# Patient Record
Sex: Male | Born: 1975 | Race: White | Hispanic: No | Marital: Single
Health system: Southern US, Community
[De-identification: ages and names within clinical notes are randomized; demographics above are authoritative.]

---

## 1999-06-15 ENCOUNTER — Encounter: Payer: Self-pay | Admitting: Surgery

## 1999-06-15 ENCOUNTER — Ambulatory Visit (HOSPITAL_COMMUNITY): Admission: RE | Admit: 1999-06-15 | Discharge: 1999-06-15 | Payer: Self-pay | Admitting: Surgery

## 2005-10-11 ENCOUNTER — Inpatient Hospital Stay (HOSPITAL_COMMUNITY): Admission: AC | Admit: 2005-10-11 | Discharge: 2005-10-18 | Payer: Self-pay

## 2005-10-14 ENCOUNTER — Ambulatory Visit: Payer: Self-pay | Admitting: Physical Medicine & Rehabilitation

## 2008-02-13 IMAGING — CR DG ELBOW 2V*L*
2 series · 2 of 2 positions shown · non-contrast
Comparison: none

CLINICAL DATA: 29-year-old with left elbow pain.
 LEFT ELBOW ? 2 VIEW:

[view not recorded (1 of 2)]
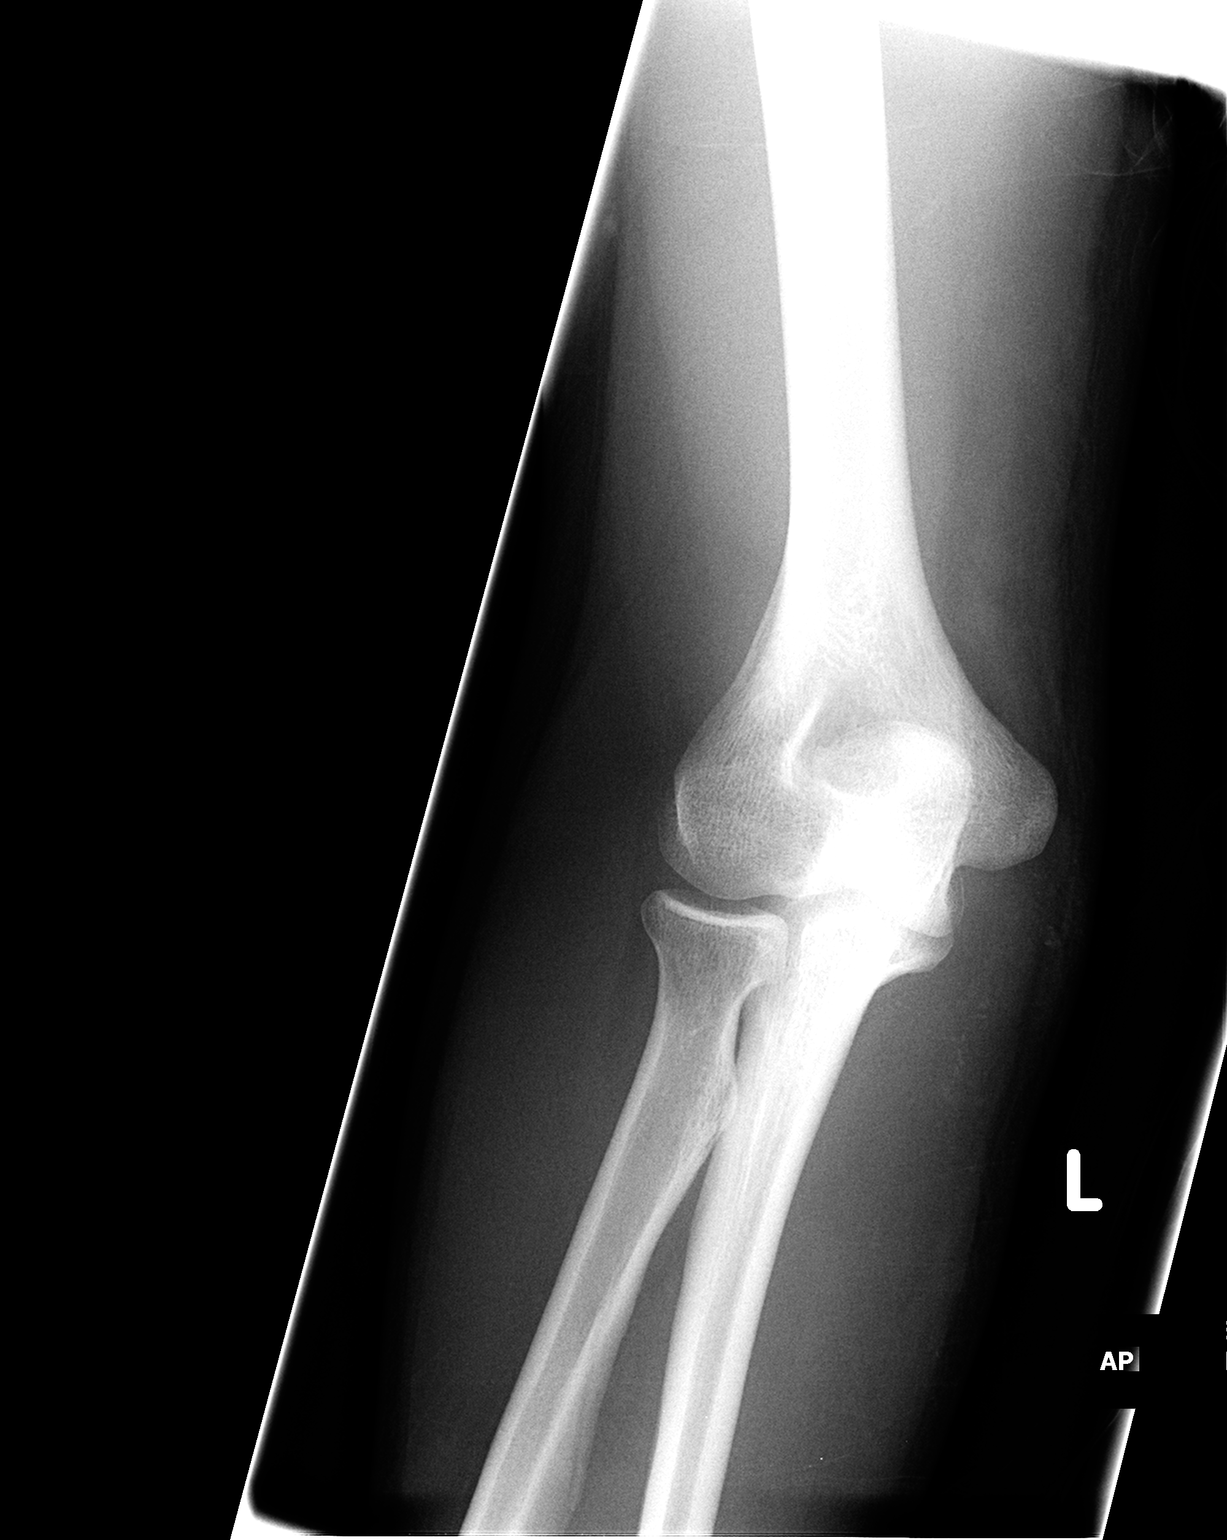

[view not recorded (2 of 2)]
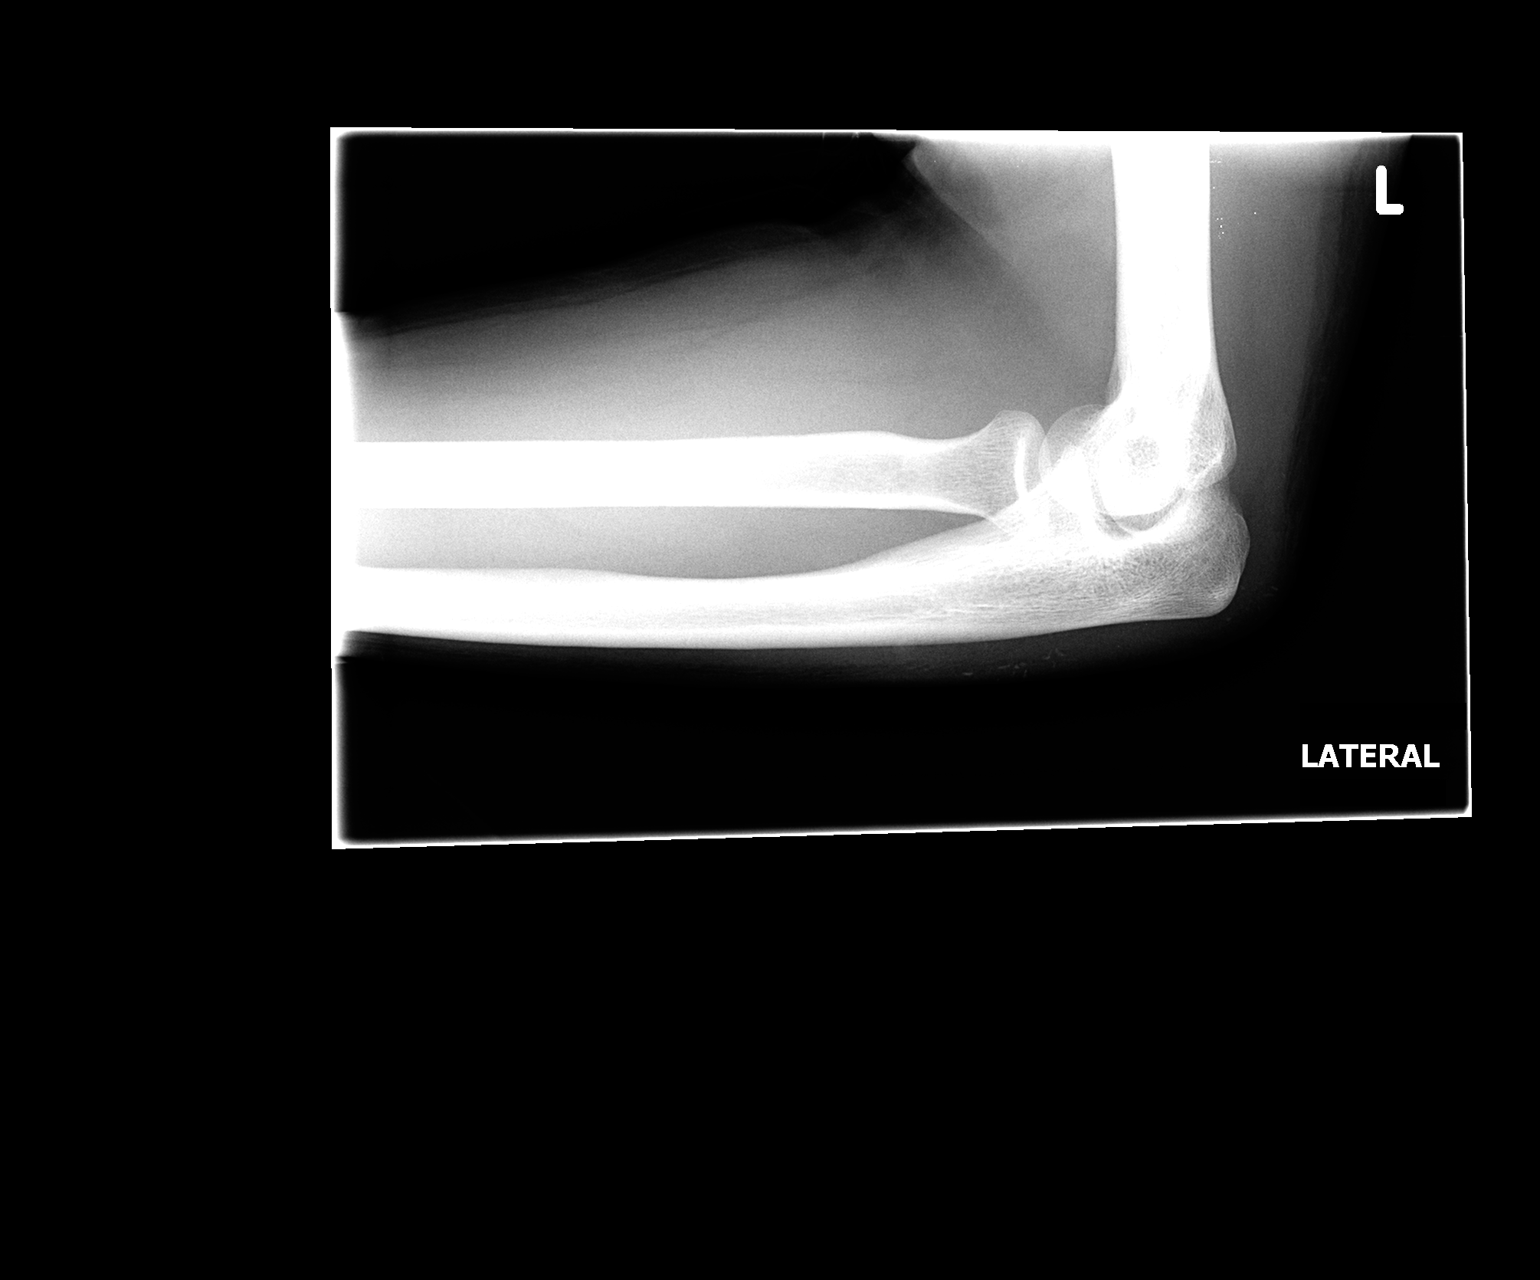

[2 of 2 positions shown; findings below may reference images not displayed]

FINDINGS: Joint spaces are maintained.  No definite fractures are seen.  No joint effusion.
IMPRESSION: No acute bony findings.

## 2008-02-15 IMAGING — CR DG CHEST 1V PORT
1 series · 1 of 1 positions shown · non-contrast
Comparison: Yesterday?s exam.

CLINICAL DATA: Trauma.  Altered level of consciousness.  
 PORTABLE CHEST ? 1 VIEW ? 7577 HOURS:

[view not recorded]
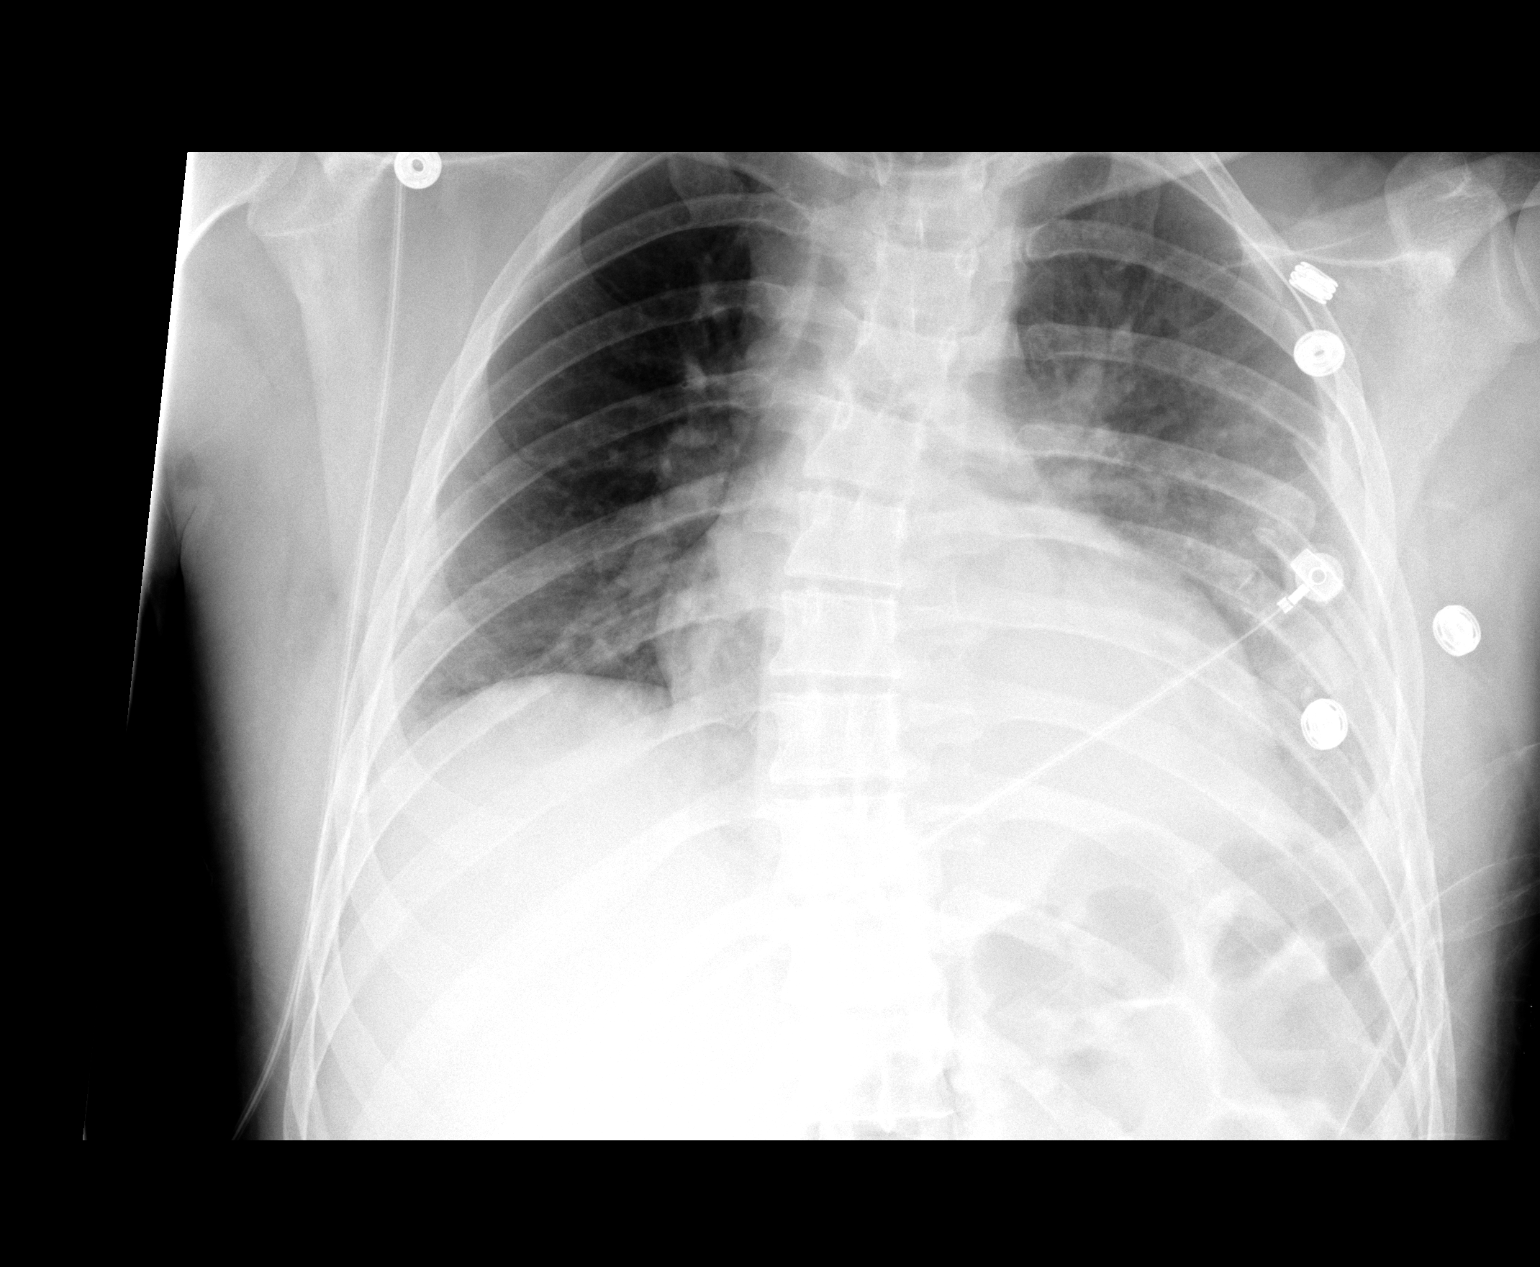

[1 of 1 positions shown; findings below may reference images not displayed]

FINDINGS: Left lower lobar atelectasis and small left pleural effusion.  No pneumothorax.  Multiple left-sided rib fractures.
IMPRESSION: Left lower lobar atelectasis/consolidation and left pleural effusion.

## 2009-12-02 ENCOUNTER — Ambulatory Visit (HOSPITAL_COMMUNITY): Admission: RE | Admit: 2009-12-02 | Discharge: 2009-12-02 | Payer: Self-pay | Admitting: Psychiatry

## 2009-12-04 ENCOUNTER — Other Ambulatory Visit (HOSPITAL_COMMUNITY): Admission: RE | Admit: 2009-12-04 | Discharge: 2010-02-04 | Payer: Self-pay | Admitting: Psychiatry

## 2010-02-05 ENCOUNTER — Ambulatory Visit: Payer: Self-pay | Admitting: Psychiatry

## 2010-07-30 LAB — URINE DRUGS OF ABUSE SCREEN W ALC, ROUTINE (REF LAB)
Barbiturate Quant, Ur: NEGATIVE
Barbiturate Quant, Ur: NEGATIVE
Benzodiazepines.: NEGATIVE
Cocaine Metabolites: NEGATIVE
Creatinine,U: 172 mg/dL
Creatinine,U: 18.1 mg/dL
Ethyl Alcohol: 20 mg/dL — ABNORMAL HIGH (ref <10)
Ethyl Alcohol: <10 mg/dL (ref <10)
Marijuana Metabolite: NEGATIVE
Marijuana Metabolite: NEGATIVE
Opiate Screen, Urine: NEGATIVE
Opiate Screen, Urine: NEGATIVE
Phencyclidine (PCP): NEGATIVE
Propoxyphene: NEGATIVE
Propoxyphene: NEGATIVE

## 2010-07-30 LAB — ETHANOL CONFIRM, URINE: Ethanol, Ur - Confirmation: 0.029 GMS% (ref ?–0.04)

## 2010-07-31 LAB — URINE DRUGS OF ABUSE SCREEN W ALC, ROUTINE (REF LAB)
Amphetamine Screen, Ur: NEGATIVE
Amphetamine Screen, Ur: NEGATIVE
Amphetamine Screen, Ur: NEGATIVE
Barbiturate Quant, Ur: NEGATIVE
Barbiturate Quant, Ur: NEGATIVE
Cocaine Metabolites: NEGATIVE
Cocaine Metabolites: NEGATIVE
Creatinine,U: 42.6 mg/dL
Creatinine,U: 36.8 mg/dL
Creatinine,U: 52.7 mg/dL
Marijuana Metabolite: NEGATIVE
Marijuana Metabolite: NEGATIVE
Methadone: NEGATIVE
Methadone: NEGATIVE
Phencyclidine (PCP): NEGATIVE
Phencyclidine (PCP): NEGATIVE
Propoxyphene: NEGATIVE
Propoxyphene: NEGATIVE

## 2021-09-02 ENCOUNTER — Encounter (INDEPENDENT_AMBULATORY_CARE_PROVIDER_SITE_OTHER): Payer: Self-pay | Admitting: *Deleted

## 2021-09-02 ENCOUNTER — Other Ambulatory Visit: Payer: Self-pay

## 2021-09-02 VITALS — BP 130/93 | HR 68 | Temp 97.6°F | Ht 68.0 in | Wt 211.0 lb

## 2021-09-02 DIAGNOSIS — Z006 Encounter for examination for normal comparison and control in clinical research program: Secondary | ICD-10-CM

## 2021-09-02 NOTE — Research (Signed)
rpr

## 2021-09-02 NOTE — Research (Signed)
Jackson Carlson seen by Research for screening visit for Purpose 2 Study.  Next appointment in May 2023. ?

## 2021-09-03 LAB — RPR: RPR Ser Ql: NONREACTIVE

## 2021-09-09 ENCOUNTER — Other Ambulatory Visit: Payer: Self-pay

## 2021-09-09 ENCOUNTER — Encounter (INDEPENDENT_AMBULATORY_CARE_PROVIDER_SITE_OTHER): Payer: Self-pay

## 2021-09-09 DIAGNOSIS — Z006 Encounter for examination for normal comparison and control in clinical research program: Secondary | ICD-10-CM

## 2021-09-09 NOTE — Research (Signed)
Rainen seen by research for lab recollect due to [employer] shipment being lost/never received by [facility].   ?

## 2021-09-22 ENCOUNTER — Ambulatory Visit (INDEPENDENT_AMBULATORY_CARE_PROVIDER_SITE_OTHER): Payer: Self-pay | Admitting: Pharmacist

## 2021-09-22 ENCOUNTER — Encounter (INDEPENDENT_AMBULATORY_CARE_PROVIDER_SITE_OTHER): Payer: Self-pay

## 2021-09-22 ENCOUNTER — Other Ambulatory Visit: Payer: Self-pay

## 2021-09-22 ENCOUNTER — Encounter (HOSPITAL_COMMUNITY): Payer: Self-pay

## 2021-09-22 VITALS — BP 113/80 | HR 88 | Temp 98.1°F | Resp 18 | Wt 211.5 lb

## 2021-09-22 DIAGNOSIS — Z006 Encounter for examination for normal comparison and control in clinical research program: Secondary | ICD-10-CM

## 2021-09-22 MED ORDER — STUDY - PURPOSE 2 - EMTRICITABINE/TENOFOVIR DISOPROXIL FUMARATE 200-300 MG (TRUVADA) OR PLACEBO TABLET (PI-VAN DAM): 1.0000 | ORAL_TABLET | Freq: Every day | ORAL | 0 refills | Status: DC

## 2021-09-22 MED ORDER — STUDY - PURPOSE 2 - LENACAPAVIR 309 MG/ML OR PLACEBO INJECTION (PI-VAN DAM): 927.0000 mg | INJECTION | Freq: Once | SUBCUTANEOUS | 0 refills | Status: AC

## 2021-09-22 MED ORDER — STUDY - PURPOSE 2 - LENACAPAVIR 300 MG OR PLACEBO TABLET (PI-VAN DAM): 600.0000 mg | ORAL_TABLET | Freq: Every day | ORAL | 0 refills | Status: DC

## 2021-09-22 NOTE — Research (Signed)
study

## 2021-09-22 NOTE — Progress Notes (Signed)
? ?  09/22/2021 ? ?HPI: Jackson Carlson is a 46 y.o. male who presents for PURPOSE 2 injection administration ? ?Lenacapavir/placebo injection #1 given in lower right quadrant of the abdomen. Lenacapavir/placebo injection #2 given at least 4 inches away in a counterclockwise manner. Patient was told to turn head during administration and blinding was maintained throughout visit. A sterile gauze was applied immediately to injection sites once injection was administered to block any potential unblinding.  Patient tolerated well. Documented injection timing and other pertinent information per research requirements.  ? ? ?Margarite Gouge, PharmD, CPP ?Clinical Pharmacist Practitioner ?Infectious Diseases Clinical Pharmacist ?[facility] ? ?09/22/2021, 2:31 PM ? ?

## 2021-09-22 NOTE — Research (Signed)
Participant seen for day 1 visit for Purpose 2 study. No new concerns, overall doing well. Participant randomized, study medications dispensed.  ?

## 2021-09-24 LAB — RPR: RPR Ser Ql: NONREACTIVE

## 2021-10-20 ENCOUNTER — Other Ambulatory Visit: Payer: Self-pay

## 2021-10-20 ENCOUNTER — Encounter: Payer: Self-pay | Admitting: *Deleted

## 2021-10-20 VITALS — BP 111/77 | HR 77 | Temp 97.9°F | Wt 213.1 lb

## 2021-10-20 DIAGNOSIS — Z006 Encounter for examination for normal comparison and control in clinical research program: Secondary | ICD-10-CM

## 2021-10-20 MED ORDER — STUDY - PURPOSE 2 - EMTRICITABINE/TENOFOVIR DISOPROXIL FUMARATE 200-300 MG (TRUVADA) OR PLACEBO TABLET (PI-VAN DAM): 1.0000 | ORAL_TABLET | Freq: Every day | ORAL | 0 refills | Status: DC

## 2021-10-20 NOTE — Research (Signed)
Jehad was here for his week 4 visit for Purpose 2. He denies any new problems or medications. He does have nodules at both injection sites on his abdomen, denies any redness, tenderness or other problems from the injections.  Adherence was excellent with his truvada/placebo. He will be returning in 4 weeks.

## 2021-11-18 ENCOUNTER — Other Ambulatory Visit: Payer: Self-pay

## 2021-11-18 ENCOUNTER — Encounter (INDEPENDENT_AMBULATORY_CARE_PROVIDER_SITE_OTHER): Payer: Self-pay

## 2021-11-18 VITALS — BP 110/77 | HR 70 | Temp 97.7°F | Resp 16 | Wt 212.5 lb

## 2021-11-18 DIAGNOSIS — Z006 Encounter for examination for normal comparison and control in clinical research program: Secondary | ICD-10-CM

## 2021-11-18 MED ORDER — STUDY - PURPOSE 2 - EMTRICITABINE/TENOFOVIR DISOPROXIL FUMARATE 200-300 MG (TRUVADA) OR PLACEBO TABLET (PI-VAN DAM): 1.0000 | ORAL_TABLET | Freq: Every day | ORAL | 0 refills | Status: DC

## 2021-11-18 NOTE — Research (Signed)
Participant seen for Research Study for Week 8 of Purpose 2. [phone] Purpose 2 is a double blind study evaluating the efficacy and safety of long acting lenacapavir for HIV preexposure. It is being compared to Truvada for PREP. For patient care concerns please call [phone]. Overall, participant doing well, all procedures carried out per protocol, study medications provided.  Plan to see participant back in 5 weeks.

## 2021-12-22 ENCOUNTER — Encounter (INDEPENDENT_AMBULATORY_CARE_PROVIDER_SITE_OTHER): Payer: Self-pay | Admitting: *Deleted

## 2021-12-22 ENCOUNTER — Other Ambulatory Visit: Payer: Self-pay

## 2021-12-22 DIAGNOSIS — Z006 Encounter for examination for normal comparison and control in clinical research program: Secondary | ICD-10-CM

## 2021-12-22 MED ORDER — STUDY - PURPOSE 2 - EMTRICITABINE/TENOFOVIR DISOPROXIL FUMARATE 200-300 MG (TRUVADA) OR PLACEBO TABLET (PI-VAN DAM): 1.0000 | ORAL_TABLET | Freq: Every day | ORAL | 0 refills | Status: DC

## 2021-12-22 NOTE — Research (Signed)
Brant was here for his week 13 visit for Purpose 2. His adherence was excellent with study drug and he denies any new problems. He still has 2 nodules on his abdomen from the first study injections, which do not cause any problems. 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 He will be returning in 13 weeks for the next visit.

## 2021-12-23 LAB — RPR: RPR Ser Ql: NONREACTIVE

## 2022-03-06 LAB — COLOGUARD: COLOGUARD: NEGATIVE

## 2022-03-23 ENCOUNTER — Encounter: Payer: Self-pay | Admitting: *Deleted

## 2022-03-23 ENCOUNTER — Ambulatory Visit: Payer: Self-pay | Admitting: Pharmacist

## 2022-03-24 ENCOUNTER — Other Ambulatory Visit: Payer: Self-pay

## 2022-03-24 ENCOUNTER — Encounter (INDEPENDENT_AMBULATORY_CARE_PROVIDER_SITE_OTHER): Payer: Self-pay | Admitting: *Deleted

## 2022-03-24 ENCOUNTER — Ambulatory Visit (INDEPENDENT_AMBULATORY_CARE_PROVIDER_SITE_OTHER): Payer: Self-pay | Admitting: Pharmacist

## 2022-03-24 VITALS — BP 106/67 | HR 67 | Temp 97.4°F | Wt 215.2 lb

## 2022-03-24 DIAGNOSIS — Z006 Encounter for examination for normal comparison and control in clinical research program: Secondary | ICD-10-CM

## 2022-03-24 MED ORDER — STUDY - PURPOSE 2 - EMTRICITABINE/TENOFOVIR DISOPROXIL FUMARATE 200-300 MG (TRUVADA) OR PLACEBO TABLET (PI-VAN DAM): 1.0000 | ORAL_TABLET | Freq: Every day | ORAL | 0 refills | Status: DC

## 2022-03-24 MED ORDER — STUDY - PURPOSE 2 - LENACAPAVIR 309 MG/ML OR PLACEBO INJECTION (PI-VAN DAM)
927.0000 mg | INJECTION | Freq: Once | SUBCUTANEOUS | Status: AC
  Administered 2022-03-24: 927 mg via SUBCUTANEOUS
  Filled 2022-03-24: qty 3

## 2022-03-24 NOTE — Research (Signed)
Jackson Carlson was here for his week 26 study visit for Purpose 2. He did receive 2 injections of lenacapavir today in the left side of his abdomen without any problem. His injections from 26 weeks ago are still palpable on the rt side, but do not bother him. He denies any new problems. He did get his flu and covid 19 vaccine over a month ago. He will be returning in 13 weeks for th next study visit.

## 2022-03-24 NOTE — Addendum Note (Signed)
Addended by: Phill Myron on: 03/24/2022 01:53 PM   Modules accepted: Orders

## 2022-03-24 NOTE — Progress Notes (Signed)
   03/24/2022  HPI: Jackson Carlson is a 46 y.o. male who presents for PURPOSE 2 injection administration  Lenacapavir/placebo injection #1 given in lower left quadrant of the abdomen. Lenacapavir/placebo injection #2 given at least 4 inches away in a counterclockwise manner. Patient was told to turn head during administration and blinding was maintained throughout visit. A sterile gauze was applied immediately to injection sites once injection was administered to block any potential unblinding.  Patient tolerated well. Documented injection timing and other pertinent information per research requirements in the research flowchart.    Margarite Gouge, PharmD, CPP Clinical Pharmacist Practitioner Infectious Diseases Clinical Pharmacist [facility]  03/24/2022, 10:03 AM

## 2022-03-25 LAB — RPR: RPR Ser Ql: NONREACTIVE

## 2022-04-02 ENCOUNTER — Encounter: Payer: Self-pay | Admitting: Infectious Disease

## 2022-06-22 ENCOUNTER — Other Ambulatory Visit: Payer: Self-pay

## 2022-06-22 ENCOUNTER — Encounter (INDEPENDENT_AMBULATORY_CARE_PROVIDER_SITE_OTHER): Payer: Self-pay

## 2022-06-22 DIAGNOSIS — Z006 Encounter for examination for normal comparison and control in clinical research program: Secondary | ICD-10-CM

## 2022-06-22 MED ORDER — STUDY - PURPOSE 2 - EMTRICITABINE/TENOFOVIR DISOPROXIL FUMARATE 200-300 MG (TRUVADA) OR PLACEBO TABLET (PI-VAN DAM): 1.0000 | ORAL_TABLET | Freq: Every day | ORAL | 0 refills | Status: DC

## 2022-06-22 NOTE — Research (Signed)
Individual seen for week 39 research visit for Purpose 2, GS-[phone] Purpose 2 is a double blind study evaluating the efficacy and safety of long acting lenacapavir for HIV preexposure. It is being compared to Truvada for PREP. For patient care concerns please call [phone]. All procedures carried out per protocol. No new medical issues/concerns. Plan to see again at week 52 visit.

## 2022-06-23 LAB — RPR: RPR Ser Ql: NONREACTIVE

## 2022-09-21 ENCOUNTER — Ambulatory Visit: Payer: Self-pay | Admitting: Pharmacist

## 2022-09-21 ENCOUNTER — Encounter: Payer: Self-pay | Admitting: *Deleted

## 2022-09-23 ENCOUNTER — Encounter (INDEPENDENT_AMBULATORY_CARE_PROVIDER_SITE_OTHER): Payer: Self-pay | Admitting: *Deleted

## 2022-09-23 ENCOUNTER — Other Ambulatory Visit: Payer: Self-pay

## 2022-09-23 ENCOUNTER — Ambulatory Visit (INDEPENDENT_AMBULATORY_CARE_PROVIDER_SITE_OTHER): Payer: Self-pay | Admitting: Pharmacist

## 2022-09-23 VITALS — BP 110/79 | HR 64 | Temp 97.6°F | Wt 206.0 lb

## 2022-09-23 DIAGNOSIS — Z006 Encounter for examination for normal comparison and control in clinical research program: Secondary | ICD-10-CM

## 2022-09-23 MED ORDER — STUDY - PURPOSE 2 - LENACAPAVIR 309 MG/ML OR PLACEBO INJECTION (PI-VAN DAM)
927.0000 mg | INJECTION | Freq: Once | SUBCUTANEOUS | Status: AC
  Administered 2022-09-23: 927 mg via SUBCUTANEOUS
  Filled 2022-09-23: qty 3

## 2022-09-23 MED ORDER — STUDY - PURPOSE 2 - EMTRICITABINE/TENOFOVIR DISOPROXIL FUMARATE 200-300 MG (TRUVADA) OR PLACEBO TABLET (PI-VAN DAM): 1.0000 | ORAL_TABLET | Freq: Every day | ORAL | 0 refills | Status: DC

## 2022-09-23 NOTE — Progress Notes (Signed)
   09/23/2022  HPI: Jackson Carlson is a 47 y.o. male who presents for PURPOSE 2 injection administration  Lenacapavir/placebo injection #1 given in lower right quadrant of the abdomen. Lenacapavir/placebo injection #2 given at least 4 inches away in a counterclockwise manner. Patient was told to turn head during administration and blinding was maintained throughout visit. A sterile gauze was applied immediately to injection sites once injection was administered to block any potential unblinding.  Patient tolerated well. Documented injection timing and other pertinent information per research requirements in the research flowchart.    Margarite Gouge, PharmD, CPP, BCIDP, AAHIVP  Clinical Pharmacist Practitioner Infectious Diseases Clinical Pharmacist [facility]  09/23/2022, 10:05 AM

## 2022-09-23 NOTE — Research (Signed)
Dorrance was here for his week 52 PURPOSE 2 study visit. He denies any new problems or concerns. The nodules from the injections 26 weeks ago have completely resolved. He was given 2 new injections in his abdomen of lenacapavir/placebo with out problem. He will be returning in 13 weeks for the next study visit.

## 2022-09-24 LAB — RPR: RPR Ser Ql: NONREACTIVE

## 2022-12-20 ENCOUNTER — Encounter (INDEPENDENT_AMBULATORY_CARE_PROVIDER_SITE_OTHER): Payer: Self-pay | Admitting: *Deleted

## 2022-12-20 ENCOUNTER — Other Ambulatory Visit: Payer: Self-pay | Admitting: *Deleted

## 2022-12-20 ENCOUNTER — Other Ambulatory Visit: Payer: Self-pay

## 2022-12-20 VITALS — BP 116/79 | HR 67 | Temp 97.6°F | Wt 206.0 lb

## 2022-12-20 DIAGNOSIS — Z006 Encounter for examination for normal comparison and control in clinical research program: Secondary | ICD-10-CM

## 2022-12-20 MED ORDER — STUDY - PURPOSE 2 - EMTRICITABINE/TENOFOVIR DISOPROXIL FUMARATE 200-300 MG (TRUVADA) OR PLACEBO TABLET (PI-VAN DAM): 1.0000 | ORAL_TABLET | Freq: Every day | ORAL | 0 refills | Status: DC

## 2022-12-20 MED ORDER — STUDY - PURPOSE 2 - EMTRICITABINE/TENOFOVIR DISOPROXIL FUMARATE 200-300 MG (TRUVADA) OR PLACEBO TABLET (PI-VAN DAM): 1.0000 | ORAL_TABLET | Freq: Every day | ORAL | 0 refills | Status: AC

## 2022-12-20 NOTE — Research (Signed)
Raequon was here for his week 65 visit for Purpose 2. He denies any new problems or concerns. Nodules from the last injections persist, but have not been sire to him. He will return in 13 weeks for the next visit.

## 2023-03-21 ENCOUNTER — Encounter: Payer: Managed Care, Other (non HMO) | Admitting: *Deleted

## 2023-03-22 ENCOUNTER — Encounter (INDEPENDENT_AMBULATORY_CARE_PROVIDER_SITE_OTHER): Payer: Self-pay | Admitting: *Deleted

## 2023-03-22 ENCOUNTER — Other Ambulatory Visit: Payer: Self-pay

## 2023-03-22 VITALS — BP 105/70 | HR 62 | Temp 97.8°F | Wt 204.8 lb

## 2023-03-22 DIAGNOSIS — Z006 Encounter for examination for normal comparison and control in clinical research program: Secondary | ICD-10-CM

## 2023-03-22 MED ORDER — STUDY - PURPOSE 2 (OPEN-LABEL) - LENACAPAVIR 309 MG/ML INJECTION (PI-VAN DAM)
927.0000 mg | INJECTION | Freq: Once | SUBCUTANEOUS | Status: AC
  Administered 2023-03-22: 927 mg via SUBCUTANEOUS
  Filled 2023-03-22: qty 3

## 2023-03-22 NOTE — Research (Signed)
Jackson Carlson was here for his ERBP visit for Purpose 2. He chose to stay on OLE lenacapavir and was unblinded to the lenacapavir. He denies any new problems or medications. His injection sites from 26 weeks ago are still palpable nodules but do not cause any discomfort. He was given 2 injections of lenacapavir in his left upper and lower abdomen [place].  He will be returning in 13 weeks.

## 2023-03-23 LAB — RPR: RPR Ser Ql: NONREACTIVE

## 2023-03-28 ENCOUNTER — Encounter (INDEPENDENT_AMBULATORY_CARE_PROVIDER_SITE_OTHER): Payer: Self-pay | Admitting: *Deleted

## 2023-03-28 ENCOUNTER — Other Ambulatory Visit: Payer: Self-pay

## 2023-03-28 DIAGNOSIS — Z006 Encounter for examination for normal comparison and control in clinical research program: Secondary | ICD-10-CM

## 2023-03-28 NOTE — Research (Signed)
Jackson Carlson was here to recollect a urine specimen for the [facility] study. He denies any UTI symptoms, but did stop his vesicare about 2 months ago when the prescription ran out. If sx. Develop he will contact his PCP.

## 2023-06-20 ENCOUNTER — Other Ambulatory Visit: Payer: Self-pay

## 2023-06-20 DIAGNOSIS — Z006 Encounter for examination for normal comparison and control in clinical research program: Secondary | ICD-10-CM

## 2023-06-21 LAB — RPR: RPR Ser Ql: NONREACTIVE

## 2023-06-21 NOTE — Research (Signed)
Individual seen for research visit OLE Wk 13 for Purpose 2, [phone] Purpose 2 is a double blind study evaluating the efficacy and safety of long acting lenacapavir for HIV preexposure. It is being compared to Truvada for PREP. For patient care concerns please call [phone]. New consent required, Inform Consent explained/reviewed, risk, benefits, responsibilities and other options were reviewed. Questions answered, comprehension of the study was assessed and adequate time to consider options was provided. Individual verbalized understanding and signed the informed consent witnessed by study coordinator. Signed consent was obtained prior to any procedures being done. Overall individual is doing well. All procedures carried out per protocol. Plan to see participant again in 13 weeks.

## 2023-09-20 ENCOUNTER — Other Ambulatory Visit: Payer: Self-pay

## 2023-09-20 DIAGNOSIS — Z006 Encounter for examination for normal comparison and control in clinical research program: Secondary | ICD-10-CM

## 2023-09-20 MED ORDER — STUDY - PURPOSE 2 (OPEN-LABEL) - LENACAPAVIR 309 MG/ML INJECTION (PI-VAN DAM)
927.0000 mg | INJECTION | Freq: Once | SUBCUTANEOUS | Status: AC
  Administered 2023-09-20: 927 mg via SUBCUTANEOUS
  Filled 2023-09-20: qty 3

## 2023-09-20 MED ORDER — STUDY - PURPOSE 2 (OPEN-LABEL) - LENACAPAVIR 309 MG/ML INJECTION (PI-VAN DAM)
927.0000 mg | INJECTION | Freq: Once | SUBCUTANEOUS | Status: DC
  Filled 2023-09-20: qty 3

## 2023-09-20 NOTE — Research (Signed)
 Individual seen for research visit OLE Wk 26 for Purpose 2 study with open label Lenacapavir for PREP. Overall individual is doing well. All procedures carried out per protocol. Study medications administered. Plan to see participant again in 13 weeks.

## 2023-09-23 LAB — RPR: RPR Ser Ql: REACTIVE — AB

## 2023-09-23 LAB — T PALLIDUM AB: T Pallidum Abs: POSITIVE — AB

## 2023-09-23 LAB — RPR TITER: RPR Titer: 1:16 {titer} — ABNORMAL HIGH

## 2023-09-28 ENCOUNTER — Telehealth: Payer: Self-pay

## 2023-09-28 NOTE — Telephone Encounter (Signed)
 Received results from [facility] stating that patient RPR was reactive, RPR titer 1:16. Per Garon Kaiser, PA - patient is aware and will receive first Bicillin injection at PCP tomorrow.   Jackson Carlson, CMA

## 2023-12-19 ENCOUNTER — Encounter

## 2023-12-20 ENCOUNTER — Encounter

## 2023-12-20 ENCOUNTER — Other Ambulatory Visit: Payer: Self-pay

## 2023-12-20 DIAGNOSIS — Z006 Encounter for examination for normal comparison and control in clinical research program: Secondary | ICD-10-CM

## 2023-12-20 NOTE — Research (Signed)
 Individual seen for research visit OLE week 39 for Purpose 2, The goal of this clinical study is to test how well the study drug, lenacapavir (LEN), works in preventing the risk of HIV. Overall individual is doing well. All procedures carried out per protocol. Plan to see participant again in 13 weeks.

## 2023-12-22 LAB — RPR TITER: RPR Titer: 1:1 {titer} — ABNORMAL HIGH

## 2023-12-22 LAB — RPR: RPR Ser Ql: REACTIVE — AB

## 2023-12-22 LAB — T PALLIDUM AB: T Pallidum Abs: POSITIVE — AB

## 2024-03-20 ENCOUNTER — Encounter

## 2024-03-20 ENCOUNTER — Other Ambulatory Visit: Payer: Self-pay

## 2024-03-20 DIAGNOSIS — Z006 Encounter for examination for normal comparison and control in clinical research program: Secondary | ICD-10-CM

## 2024-03-20 MED ORDER — STUDY - PURPOSE 2 (OPEN-LABEL) - LENACAPAVIR 309 MG/ML INJECTION (PI-VAN DAM)
927.0000 mg | INJECTION | Freq: Once | SUBCUTANEOUS | Status: AC
  Administered 2024-03-20: 927 mg via SUBCUTANEOUS
  Filled 2024-03-20: qty 3

## 2024-03-20 NOTE — Research (Signed)
 Individual seen for research visit OLE week 52 for Purpose 2, The goal of this clinical study is to test how well the study drug, lenacapavir (LEN), works in preventing the risk of HIV. Overall individual is doing well. All procedures carried out per protocol. Plan to see participant again in 13 weeks.

## 2024-04-05 LAB — RPR: RPR Ser Ql: REACTIVE — AB

## 2024-04-05 LAB — T PALLIDUM AB: T Pallidum Abs: POSITIVE — AB

## 2024-04-05 LAB — RPR TITER: RPR Titer: 1:2 {titer} — ABNORMAL HIGH

## 2024-06-20 ENCOUNTER — Other Ambulatory Visit: Payer: Self-pay

## 2024-06-20 ENCOUNTER — Encounter

## 2024-06-20 DIAGNOSIS — Z006 Encounter for examination for normal comparison and control in clinical research program: Secondary | ICD-10-CM

## 2024-06-20 NOTE — Research (Signed)
 Individual seen for research visit OLE week 52 for Purpose 2, The goal of this clinical study is to test how well the study drug, lenacapavir (LEN), works in preventing the risk of HIV. Overall individual is doing well. All procedures carried out per protocol. Plan to see participant again in 13 weeks.
